# Patient Record
Sex: Female | Born: 2000
Health system: Southern US, Community
[De-identification: ages and names within clinical notes are randomized; demographics above are authoritative.]

## PROBLEM LIST (undated history)

## (undated) DIAGNOSIS — L709 Acne, unspecified: Secondary | ICD-10-CM

## (undated) HISTORY — DX: Acne, unspecified: L70.9

---
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
## 2018-02-03 DIAGNOSIS — Z6821 Body mass index (BMI) 21.0-21.9, adult: Secondary | ICD-10-CM | POA: Diagnosis not present

## 2018-02-03 DIAGNOSIS — Z01419 Encounter for gynecological examination (general) (routine) without abnormal findings: Secondary | ICD-10-CM | POA: Diagnosis not present

## 2018-02-15 DIAGNOSIS — J101 Influenza due to other identified influenza virus with other respiratory manifestations: Secondary | ICD-10-CM | POA: Diagnosis not present

## 2018-02-18 DIAGNOSIS — R3989 Other symptoms and signs involving the genitourinary system: Secondary | ICD-10-CM | POA: Diagnosis not present

## 2018-02-18 DIAGNOSIS — R300 Dysuria: Secondary | ICD-10-CM | POA: Diagnosis not present

## 2018-02-18 DIAGNOSIS — R399 Unspecified symptoms and signs involving the genitourinary system: Secondary | ICD-10-CM | POA: Diagnosis not present

## 2018-03-18 DIAGNOSIS — K080 Exfoliation of teeth due to systemic causes: Secondary | ICD-10-CM | POA: Diagnosis not present

## 2018-04-07 DIAGNOSIS — R3129 Other microscopic hematuria: Secondary | ICD-10-CM | POA: Diagnosis not present

## 2018-04-07 DIAGNOSIS — R300 Dysuria: Secondary | ICD-10-CM | POA: Diagnosis not present

## 2018-04-07 DIAGNOSIS — R3915 Urgency of urination: Secondary | ICD-10-CM | POA: Diagnosis not present

## 2018-07-25 DIAGNOSIS — K921 Melena: Secondary | ICD-10-CM | POA: Diagnosis not present

## 2018-07-25 DIAGNOSIS — K625 Hemorrhage of anus and rectum: Secondary | ICD-10-CM | POA: Diagnosis not present

## 2018-07-25 DIAGNOSIS — R195 Other fecal abnormalities: Secondary | ICD-10-CM | POA: Diagnosis not present

## 2018-07-25 DIAGNOSIS — R1033 Periumbilical pain: Secondary | ICD-10-CM | POA: Diagnosis not present

## 2018-08-04 DIAGNOSIS — R300 Dysuria: Secondary | ICD-10-CM | POA: Diagnosis not present

## 2018-08-04 DIAGNOSIS — R3129 Other microscopic hematuria: Secondary | ICD-10-CM | POA: Diagnosis not present

## 2018-08-04 DIAGNOSIS — Z87440 Personal history of urinary (tract) infections: Secondary | ICD-10-CM | POA: Diagnosis not present

## 2018-08-04 DIAGNOSIS — R399 Unspecified symptoms and signs involving the genitourinary system: Secondary | ICD-10-CM | POA: Diagnosis not present

## 2018-08-07 DIAGNOSIS — L700 Acne vulgaris: Secondary | ICD-10-CM | POA: Diagnosis not present

## 2018-08-11 DIAGNOSIS — N946 Dysmenorrhea, unspecified: Secondary | ICD-10-CM | POA: Diagnosis not present

## 2018-08-11 DIAGNOSIS — L700 Acne vulgaris: Secondary | ICD-10-CM | POA: Diagnosis not present

## 2018-08-19 DIAGNOSIS — R2233 Localized swelling, mass and lump, upper limb, bilateral: Secondary | ICD-10-CM | POA: Diagnosis not present

## 2018-08-21 DIAGNOSIS — K2980 Duodenitis without bleeding: Secondary | ICD-10-CM | POA: Diagnosis not present

## 2018-08-21 DIAGNOSIS — K648 Other hemorrhoids: Secondary | ICD-10-CM | POA: Diagnosis not present

## 2018-08-21 DIAGNOSIS — K2930 Chronic superficial gastritis without bleeding: Secondary | ICD-10-CM | POA: Diagnosis not present

## 2018-08-21 DIAGNOSIS — K625 Hemorrhage of anus and rectum: Secondary | ICD-10-CM | POA: Diagnosis not present

## 2018-08-21 DIAGNOSIS — R1084 Generalized abdominal pain: Secondary | ICD-10-CM | POA: Diagnosis not present

## 2018-08-21 DIAGNOSIS — R197 Diarrhea, unspecified: Secondary | ICD-10-CM | POA: Diagnosis not present

## 2018-08-27 DIAGNOSIS — R2233 Localized swelling, mass and lump, upper limb, bilateral: Secondary | ICD-10-CM | POA: Diagnosis not present

## 2018-08-27 DIAGNOSIS — N6489 Other specified disorders of breast: Secondary | ICD-10-CM | POA: Diagnosis not present

## 2018-09-18 DIAGNOSIS — R300 Dysuria: Secondary | ICD-10-CM | POA: Diagnosis not present

## 2018-09-18 DIAGNOSIS — Z01812 Encounter for preprocedural laboratory examination: Secondary | ICD-10-CM | POA: Diagnosis not present

## 2018-09-18 DIAGNOSIS — Z1159 Encounter for screening for other viral diseases: Secondary | ICD-10-CM | POA: Diagnosis not present

## 2018-09-25 DIAGNOSIS — N3010 Interstitial cystitis (chronic) without hematuria: Secondary | ICD-10-CM | POA: Diagnosis not present

## 2018-09-25 DIAGNOSIS — R300 Dysuria: Secondary | ICD-10-CM | POA: Diagnosis not present

## 2018-10-03 DIAGNOSIS — Z7189 Other specified counseling: Secondary | ICD-10-CM | POA: Diagnosis not present

## 2018-10-03 DIAGNOSIS — Z03818 Encounter for observation for suspected exposure to other biological agents ruled out: Secondary | ICD-10-CM | POA: Diagnosis not present

## 2018-10-03 DIAGNOSIS — Z20828 Contact with and (suspected) exposure to other viral communicable diseases: Secondary | ICD-10-CM | POA: Diagnosis not present

## 2018-10-23 DIAGNOSIS — N390 Urinary tract infection, site not specified: Secondary | ICD-10-CM | POA: Diagnosis not present

## 2018-10-30 DIAGNOSIS — R310 Gross hematuria: Secondary | ICD-10-CM | POA: Diagnosis not present

## 2018-10-30 DIAGNOSIS — R300 Dysuria: Secondary | ICD-10-CM | POA: Diagnosis not present

## 2018-10-30 DIAGNOSIS — R82998 Other abnormal findings in urine: Secondary | ICD-10-CM | POA: Diagnosis not present

## 2018-10-30 DIAGNOSIS — R3915 Urgency of urination: Secondary | ICD-10-CM | POA: Diagnosis not present

## 2018-12-16 DIAGNOSIS — Z7189 Other specified counseling: Secondary | ICD-10-CM | POA: Diagnosis not present

## 2018-12-16 DIAGNOSIS — Z03818 Encounter for observation for suspected exposure to other biological agents ruled out: Secondary | ICD-10-CM | POA: Diagnosis not present

## 2018-12-16 DIAGNOSIS — Z20828 Contact with and (suspected) exposure to other viral communicable diseases: Secondary | ICD-10-CM | POA: Diagnosis not present

## 2019-02-23 DIAGNOSIS — L700 Acne vulgaris: Secondary | ICD-10-CM | POA: Diagnosis not present

## 2019-02-23 DIAGNOSIS — D2262 Melanocytic nevi of left upper limb, including shoulder: Secondary | ICD-10-CM | POA: Diagnosis not present

## 2019-05-19 DIAGNOSIS — Z03818 Encounter for observation for suspected exposure to other biological agents ruled out: Secondary | ICD-10-CM | POA: Diagnosis not present

## 2019-05-26 DIAGNOSIS — D2262 Melanocytic nevi of left upper limb, including shoulder: Secondary | ICD-10-CM | POA: Diagnosis not present

## 2019-05-26 DIAGNOSIS — L700 Acne vulgaris: Secondary | ICD-10-CM | POA: Diagnosis not present

## 2019-06-09 DIAGNOSIS — Z03818 Encounter for observation for suspected exposure to other biological agents ruled out: Secondary | ICD-10-CM | POA: Diagnosis not present

## 2019-06-09 DIAGNOSIS — K625 Hemorrhage of anus and rectum: Secondary | ICD-10-CM | POA: Diagnosis not present

## 2019-06-11 DIAGNOSIS — K625 Hemorrhage of anus and rectum: Secondary | ICD-10-CM | POA: Diagnosis not present

## 2019-06-25 IMAGING — CR DG CHEST 2V
2 series · 2 of 2 positions shown · non-contrast
Comparison: None.

CLINICAL DATA: Mid chest burning sensation radiating to the upper
throat this morning. Cough. Nonsmoker.

EXAM:
CHEST - 2 VIEW

[w chest pa]
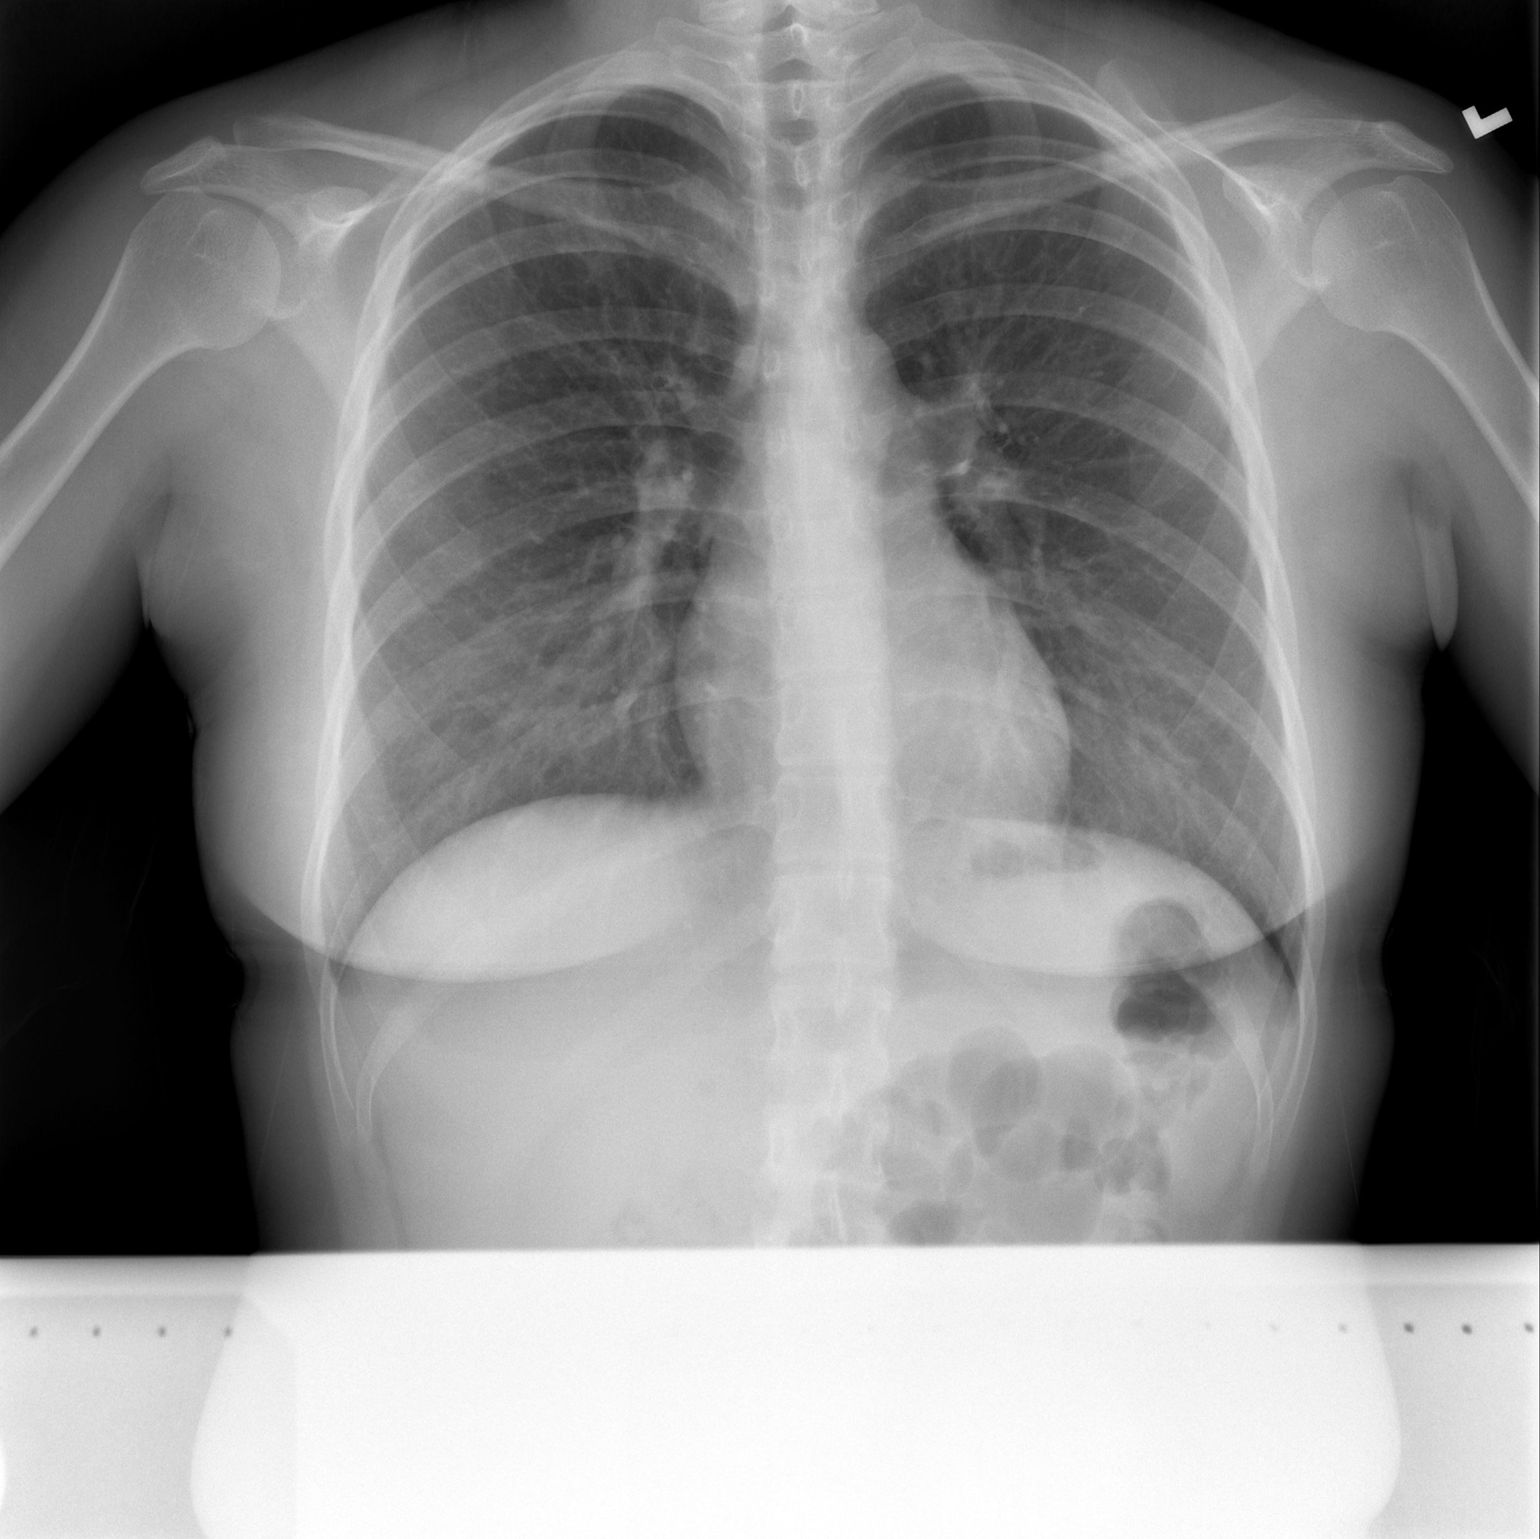

[w chest lat]
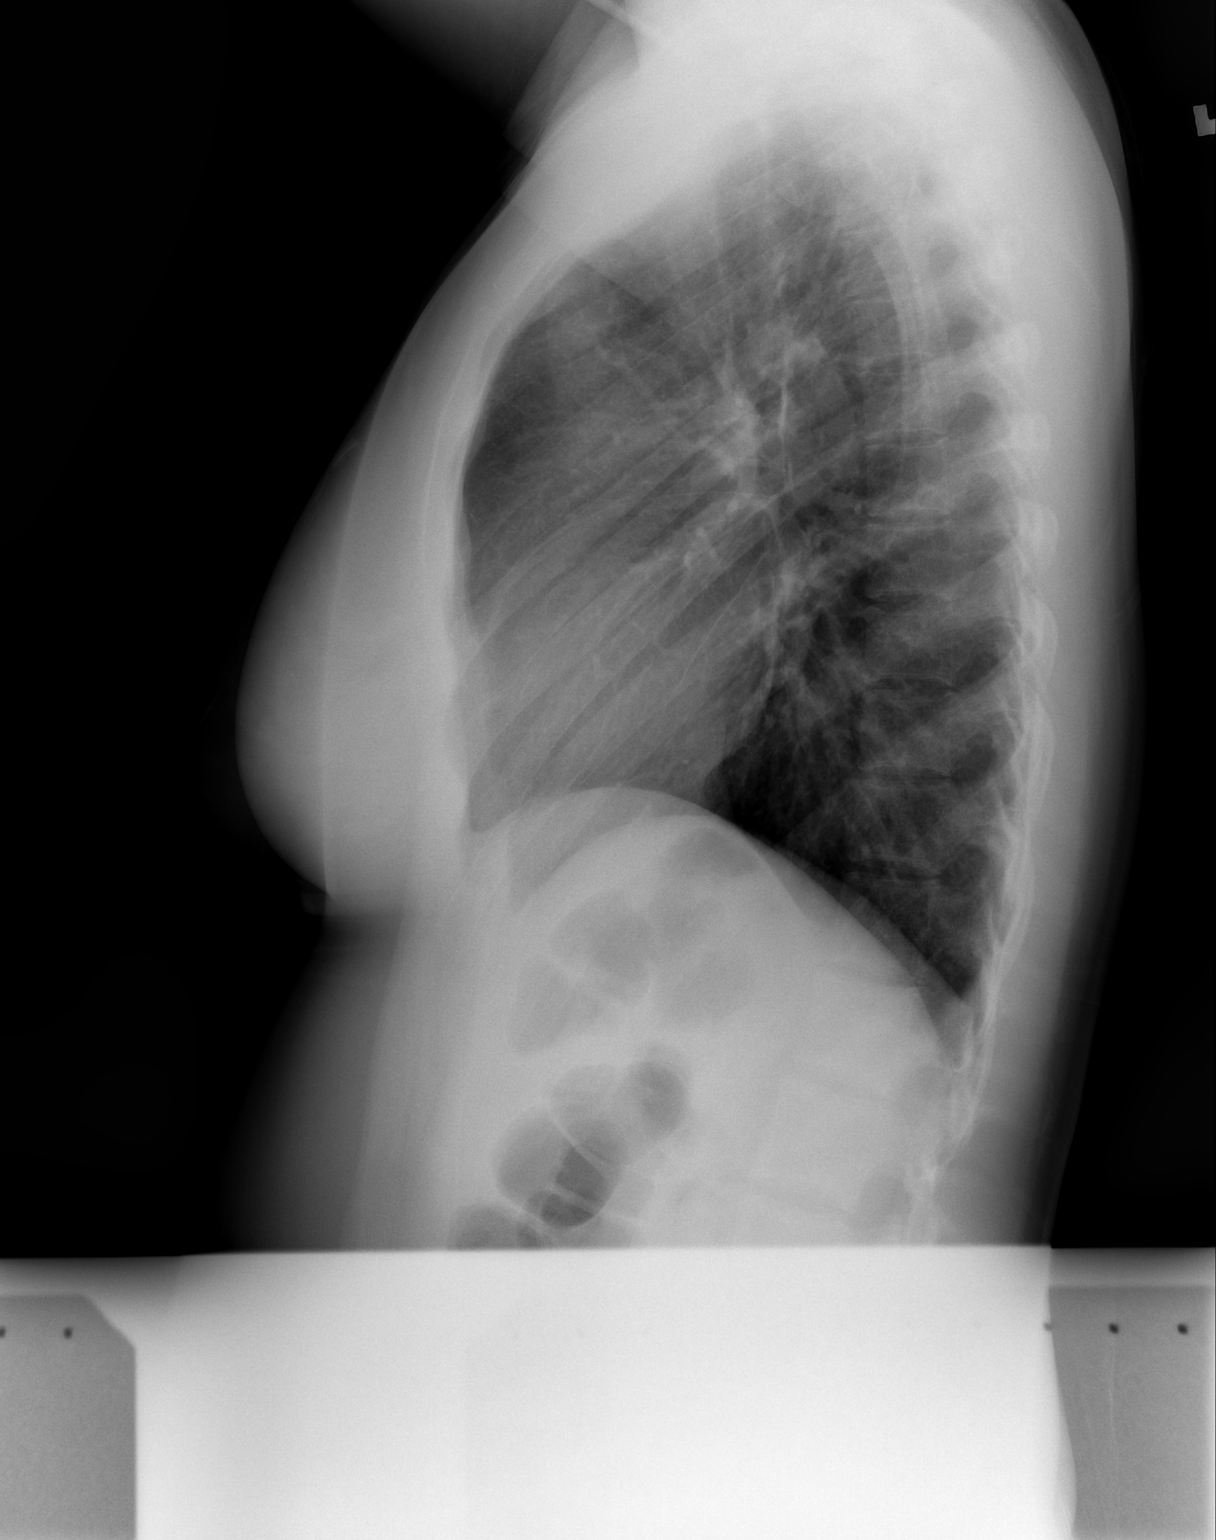

[2 of 2 positions shown; findings below may reference images not displayed]

FINDINGS: The heart size and mediastinal contours are within normal limits.
Both lungs are clear. The visualized skeletal structures are
unremarkable.
IMPRESSION: No active cardiopulmonary disease.

## 2019-10-02 DIAGNOSIS — Z03818 Encounter for observation for suspected exposure to other biological agents ruled out: Secondary | ICD-10-CM | POA: Diagnosis not present

## 2019-10-04 DIAGNOSIS — Z03818 Encounter for observation for suspected exposure to other biological agents ruled out: Secondary | ICD-10-CM | POA: Diagnosis not present

## 2019-11-03 DIAGNOSIS — Z03818 Encounter for observation for suspected exposure to other biological agents ruled out: Secondary | ICD-10-CM | POA: Diagnosis not present

## 2019-11-17 DIAGNOSIS — Z03818 Encounter for observation for suspected exposure to other biological agents ruled out: Secondary | ICD-10-CM | POA: Diagnosis not present

## 2019-11-24 DIAGNOSIS — Z03818 Encounter for observation for suspected exposure to other biological agents ruled out: Secondary | ICD-10-CM | POA: Diagnosis not present

## 2019-12-16 DIAGNOSIS — Z03818 Encounter for observation for suspected exposure to other biological agents ruled out: Secondary | ICD-10-CM | POA: Diagnosis not present

## 2020-01-06 DIAGNOSIS — Z23 Encounter for immunization: Secondary | ICD-10-CM | POA: Diagnosis not present

## 2020-01-12 DIAGNOSIS — Z23 Encounter for immunization: Secondary | ICD-10-CM | POA: Diagnosis not present

## 2020-01-19 DIAGNOSIS — Z03818 Encounter for observation for suspected exposure to other biological agents ruled out: Secondary | ICD-10-CM | POA: Diagnosis not present

## 2020-02-29 DIAGNOSIS — Z03818 Encounter for observation for suspected exposure to other biological agents ruled out: Secondary | ICD-10-CM | POA: Diagnosis not present

## 2020-03-24 DIAGNOSIS — N3010 Interstitial cystitis (chronic) without hematuria: Secondary | ICD-10-CM | POA: Diagnosis not present

## 2020-03-24 DIAGNOSIS — R300 Dysuria: Secondary | ICD-10-CM | POA: Diagnosis not present

## 2020-04-12 DIAGNOSIS — Z03818 Encounter for observation for suspected exposure to other biological agents ruled out: Secondary | ICD-10-CM | POA: Diagnosis not present

## 2020-04-12 DIAGNOSIS — Z20828 Contact with and (suspected) exposure to other viral communicable diseases: Secondary | ICD-10-CM | POA: Diagnosis not present

## 2020-04-29 DIAGNOSIS — Z01419 Encounter for gynecological examination (general) (routine) without abnormal findings: Secondary | ICD-10-CM | POA: Diagnosis not present

## 2020-04-29 DIAGNOSIS — R8290 Unspecified abnormal findings in urine: Secondary | ICD-10-CM | POA: Diagnosis not present

## 2020-04-29 DIAGNOSIS — Z0000 Encounter for general adult medical examination without abnormal findings: Secondary | ICD-10-CM | POA: Diagnosis not present

## 2020-04-29 DIAGNOSIS — Z113 Encounter for screening for infections with a predominantly sexual mode of transmission: Secondary | ICD-10-CM | POA: Diagnosis not present

## 2020-04-29 DIAGNOSIS — K589 Irritable bowel syndrome without diarrhea: Secondary | ICD-10-CM | POA: Diagnosis not present

## 2020-04-29 DIAGNOSIS — Z13220 Encounter for screening for lipoid disorders: Secondary | ICD-10-CM | POA: Diagnosis not present

## 2020-05-02 DIAGNOSIS — U071 COVID-19: Secondary | ICD-10-CM | POA: Diagnosis not present

## 2020-05-02 DIAGNOSIS — Z03818 Encounter for observation for suspected exposure to other biological agents ruled out: Secondary | ICD-10-CM | POA: Diagnosis not present

## 2020-05-30 DIAGNOSIS — Z03818 Encounter for observation for suspected exposure to other biological agents ruled out: Secondary | ICD-10-CM | POA: Diagnosis not present

## 2020-05-31 DIAGNOSIS — U071 COVID-19: Secondary | ICD-10-CM | POA: Diagnosis not present

## 2020-06-28 DIAGNOSIS — N3010 Interstitial cystitis (chronic) without hematuria: Secondary | ICD-10-CM | POA: Diagnosis not present

## 2020-08-18 DIAGNOSIS — L918 Other hypertrophic disorders of the skin: Secondary | ICD-10-CM | POA: Diagnosis not present

## 2020-08-18 DIAGNOSIS — L700 Acne vulgaris: Secondary | ICD-10-CM | POA: Diagnosis not present

## 2020-11-22 DIAGNOSIS — M26601 Right temporomandibular joint disorder, unspecified: Secondary | ICD-10-CM | POA: Diagnosis not present

## 2020-11-24 DIAGNOSIS — M26601 Right temporomandibular joint disorder, unspecified: Secondary | ICD-10-CM | POA: Diagnosis not present

## 2020-11-28 DIAGNOSIS — F411 Generalized anxiety disorder: Secondary | ICD-10-CM | POA: Diagnosis not present

## 2020-12-01 DIAGNOSIS — M26601 Right temporomandibular joint disorder, unspecified: Secondary | ICD-10-CM | POA: Diagnosis not present

## 2020-12-22 DIAGNOSIS — Z20822 Contact with and (suspected) exposure to covid-19: Secondary | ICD-10-CM | POA: Diagnosis not present

## 2021-01-09 DIAGNOSIS — N3010 Interstitial cystitis (chronic) without hematuria: Secondary | ICD-10-CM | POA: Diagnosis not present

## 2021-01-10 DIAGNOSIS — F411 Generalized anxiety disorder: Secondary | ICD-10-CM | POA: Diagnosis not present

## 2021-01-11 DIAGNOSIS — K625 Hemorrhage of anus and rectum: Secondary | ICD-10-CM | POA: Diagnosis not present

## 2021-02-23 ENCOUNTER — Ambulatory Visit: Payer: Federal, State, Local not specified - PPO | Attending: Internal Medicine

## 2021-02-23 DIAGNOSIS — Z23 Encounter for immunization: Secondary | ICD-10-CM

## 2021-02-23 NOTE — Progress Notes (Signed)
° °  Covid-19 Vaccination Clinic  Name:  Sheila Mejia    MRN: [id number] DOB: 10/12/2000  02/23/2021  [name] was observed post Covid-19 immunization for 15 minutes without incident. She was provided with Vaccine Information Sheet and instruction to access the V-Safe system.   [name] was instructed to call 911 with any severe reactions post vaccine: Difficulty breathing  Swelling of face and throat  A fast heartbeat  A bad rash all over body  Dizziness and weakness   Immunizations Administered     Name Date Dose VIS Date Route   Moderna Covid-19 vaccine Bivalent Booster 02/23/2021 12:57 PM 0.5 mL 10/01/2020 Intramuscular   Manufacturer: Levan Hurst   Lot: [id number]   [id number]

## 2021-02-24 ENCOUNTER — Other Ambulatory Visit (HOSPITAL_BASED_OUTPATIENT_CLINIC_OR_DEPARTMENT_OTHER): Payer: Self-pay

## 2021-02-24 MED ORDER — MODERNA COVID-19 BIVAL BOOSTER 50 MCG/0.5ML IM SUSP
INTRAMUSCULAR | 0 refills | Status: AC
  Filled 2021-02-24: qty 0.5, 1d supply, fill #0

## 2021-04-24 DIAGNOSIS — L249 Irritant contact dermatitis, unspecified cause: Secondary | ICD-10-CM | POA: Diagnosis not present

## 2021-04-24 DIAGNOSIS — L700 Acne vulgaris: Secondary | ICD-10-CM | POA: Diagnosis not present

## 2021-05-01 DIAGNOSIS — K625 Hemorrhage of anus and rectum: Secondary | ICD-10-CM | POA: Diagnosis not present

## 2021-05-05 DIAGNOSIS — K625 Hemorrhage of anus and rectum: Secondary | ICD-10-CM | POA: Diagnosis not present

## 2022-01-09 DIAGNOSIS — N3941 Urge incontinence: Secondary | ICD-10-CM | POA: Diagnosis not present

## 2022-01-09 DIAGNOSIS — R82998 Other abnormal findings in urine: Secondary | ICD-10-CM | POA: Diagnosis not present

## 2022-01-09 DIAGNOSIS — N3010 Interstitial cystitis (chronic) without hematuria: Secondary | ICD-10-CM | POA: Diagnosis not present

## 2022-01-09 DIAGNOSIS — N760 Acute vaginitis: Secondary | ICD-10-CM | POA: Diagnosis not present

## 2022-01-09 DIAGNOSIS — Z113 Encounter for screening for infections with a predominantly sexual mode of transmission: Secondary | ICD-10-CM | POA: Diagnosis not present

## 2022-05-02 DIAGNOSIS — Z8719 Personal history of other diseases of the digestive system: Secondary | ICD-10-CM | POA: Diagnosis not present

## 2022-05-28 DIAGNOSIS — K580 Irritable bowel syndrome with diarrhea: Secondary | ICD-10-CM | POA: Diagnosis not present

## 2022-05-28 DIAGNOSIS — Z23 Encounter for immunization: Secondary | ICD-10-CM | POA: Diagnosis not present

## 2022-05-28 DIAGNOSIS — F411 Generalized anxiety disorder: Secondary | ICD-10-CM | POA: Diagnosis not present

## 2022-05-28 DIAGNOSIS — Z6826 Body mass index (BMI) 26.0-26.9, adult: Secondary | ICD-10-CM | POA: Diagnosis not present

## 2022-05-28 DIAGNOSIS — N3010 Interstitial cystitis (chronic) without hematuria: Secondary | ICD-10-CM | POA: Diagnosis not present

## 2022-08-07 DIAGNOSIS — T162XXA Foreign body in left ear, initial encounter: Secondary | ICD-10-CM | POA: Diagnosis not present

## 2022-08-07 DIAGNOSIS — S01349A Puncture wound with foreign body of unspecified ear, initial encounter: Secondary | ICD-10-CM | POA: Diagnosis not present

## 2022-08-09 DIAGNOSIS — L2089 Other atopic dermatitis: Secondary | ICD-10-CM | POA: Diagnosis not present

## 2022-08-09 DIAGNOSIS — L700 Acne vulgaris: Secondary | ICD-10-CM | POA: Diagnosis not present

## 2023-02-15 DIAGNOSIS — R3915 Urgency of urination: Secondary | ICD-10-CM | POA: Diagnosis not present

## 2023-02-15 DIAGNOSIS — N3010 Interstitial cystitis (chronic) without hematuria: Secondary | ICD-10-CM | POA: Diagnosis not present

## 2023-02-15 DIAGNOSIS — N920 Excessive and frequent menstruation with regular cycle: Secondary | ICD-10-CM | POA: Diagnosis not present

## 2023-05-28 DIAGNOSIS — R002 Palpitations: Secondary | ICD-10-CM | POA: Diagnosis not present

## 2023-05-28 DIAGNOSIS — N946 Dysmenorrhea, unspecified: Secondary | ICD-10-CM | POA: Diagnosis not present

## 2023-05-28 DIAGNOSIS — L700 Acne vulgaris: Secondary | ICD-10-CM | POA: Diagnosis not present

## 2023-05-28 DIAGNOSIS — Z0000 Encounter for general adult medical examination without abnormal findings: Secondary | ICD-10-CM | POA: Diagnosis not present

## 2023-05-28 DIAGNOSIS — K580 Irritable bowel syndrome with diarrhea: Secondary | ICD-10-CM | POA: Diagnosis not present

## 2023-05-28 DIAGNOSIS — Z13220 Encounter for screening for lipoid disorders: Secondary | ICD-10-CM | POA: Diagnosis not present

## 2023-06-29 DIAGNOSIS — R002 Palpitations: Secondary | ICD-10-CM | POA: Diagnosis not present

## 2023-07-11 DIAGNOSIS — L700 Acne vulgaris: Secondary | ICD-10-CM | POA: Diagnosis not present

## 2024-01-10 DIAGNOSIS — Z23 Encounter for immunization: Secondary | ICD-10-CM | POA: Diagnosis not present

## 2024-01-10 DIAGNOSIS — Z111 Encounter for screening for respiratory tuberculosis: Secondary | ICD-10-CM | POA: Diagnosis not present
